# Patient Record
Sex: Male | Born: 1980 | Race: Black or African American | Hispanic: No | Marital: Married | Smoking: Never smoker
Health system: Southern US, Community
[De-identification: ages and names within clinical notes are randomized; demographics above are authoritative.]

## PROBLEM LIST (undated history)

## (undated) DIAGNOSIS — I10 Essential (primary) hypertension: Secondary | ICD-10-CM

---

## 2011-07-06 ENCOUNTER — Ambulatory Visit: Payer: Self-pay | Admitting: Internal Medicine

## 2015-04-26 ENCOUNTER — Other Ambulatory Visit: Payer: Self-pay | Admitting: Orthopedic Surgery

## 2015-04-26 DIAGNOSIS — M25561 Pain in right knee: Secondary | ICD-10-CM

## 2015-04-26 DIAGNOSIS — M25562 Pain in left knee: Secondary | ICD-10-CM

## 2015-05-18 ENCOUNTER — Ambulatory Visit
Admission: RE | Admit: 2015-05-18 | Discharge: 2015-05-18 | Disposition: A | Discharge status: Home / Self Care | Source: Ambulatory Visit | Attending: Orthopedic Surgery | Admitting: Orthopedic Surgery

## 2015-05-18 DIAGNOSIS — M25561 Pain in right knee: Secondary | ICD-10-CM | POA: Insufficient documentation

## 2015-05-18 DIAGNOSIS — M94261 Chondromalacia, right knee: Secondary | ICD-10-CM | POA: Insufficient documentation

## 2015-05-18 DIAGNOSIS — M25562 Pain in left knee: Secondary | ICD-10-CM | POA: Diagnosis present

## 2015-05-18 DIAGNOSIS — M1711 Unilateral primary osteoarthritis, right knee: Secondary | ICD-10-CM | POA: Insufficient documentation

## 2015-05-18 DIAGNOSIS — M67962 Unspecified disorder of synovium and tendon, left lower leg: Secondary | ICD-10-CM | POA: Diagnosis not present

## 2017-05-15 IMAGING — MR MR KNEE*L* W/O CM
6 series · 38 of 40 positions shown · non-contrast
Comparison: None.

CLINICAL DATA: Twisting injury both knees 2-3 months ago with
continued pain. Initial encounter.

EXAM:
MRI OF THE LEFT KNEE WITHOUT CONTRAST
TECHNIQUE: Multiplanar, multisequence MR imaging of the knee was performed. No
intravenous contrast was administered.

[Series 3: PD fat-sat · axial · 3.0mm · 0.50mm/px · z∈[-88,+41]mm · 8 of 40 slices shown (1 of 4)]
[im 1/40]
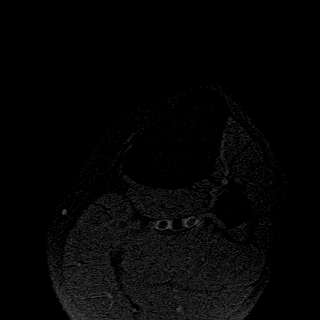
[im 5/40]
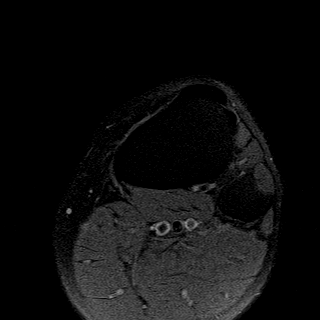
[im 14/40]
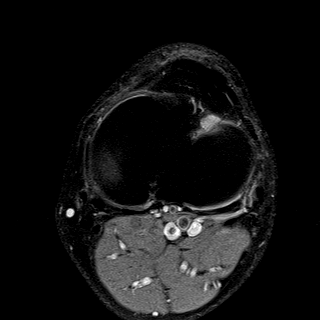
[im 18/40]
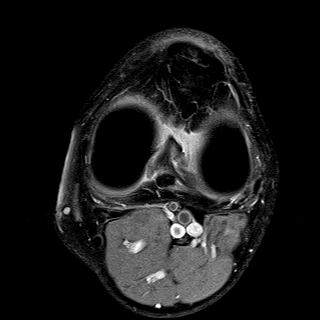
[im 22/40]
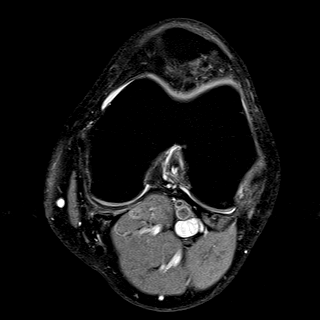
[im 27/40]
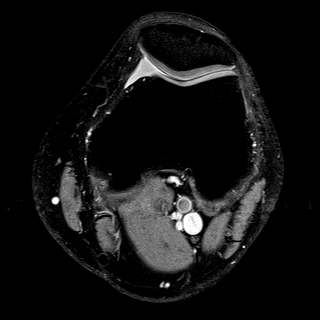
[im 35/40]
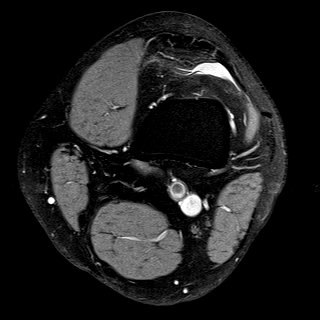
[im 40/40]
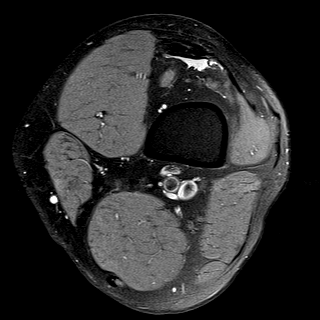

[Series 4: T1 · coronal · 3.0mm · 0.50mm/px · 8 of 35 slices shown]
[im 1/35]
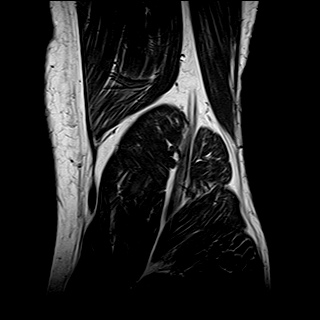
[im 5/35]
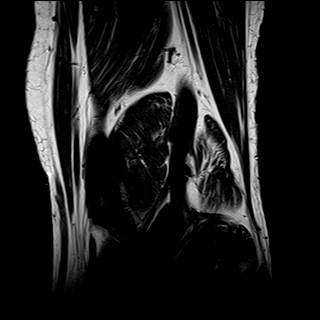
[im 10/35]
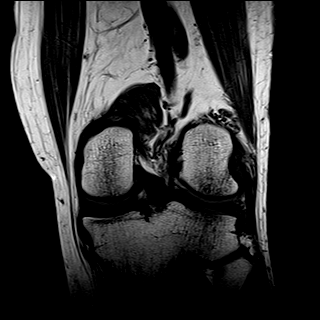
[im 15/35]
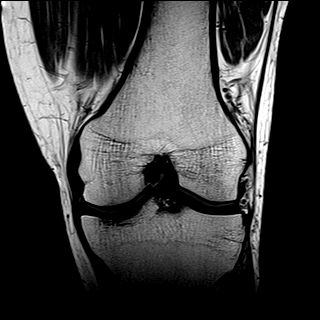
[im 20/35]
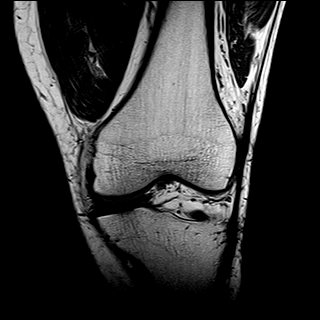
[im 25/35]
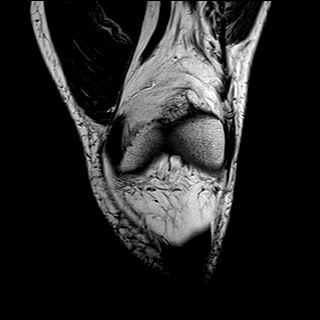
[im 30/35]
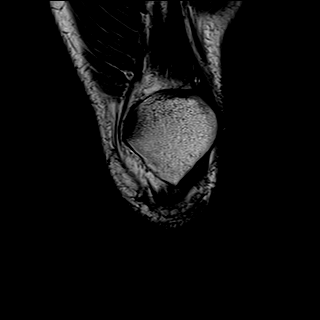
[im 35/35]
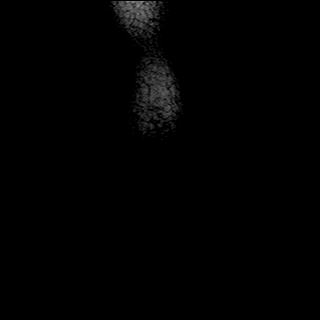

[Series 5: PD fat-sat · sagittal · 3.0mm · 0.62mm/px · 8 of 35 slices shown (2 of 4)]
[im 1/35]
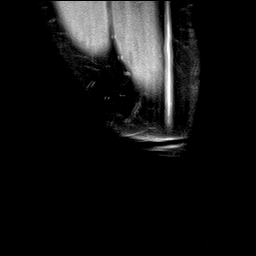
[im 5/35]
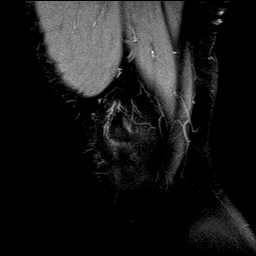
[im 10/35]
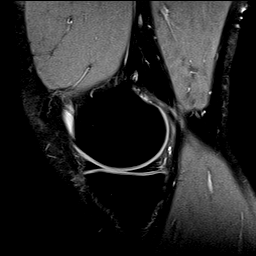
[im 15/35]
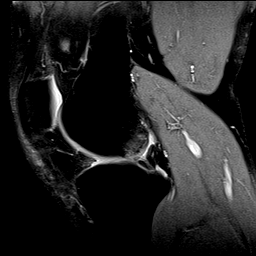
[im 20/35]
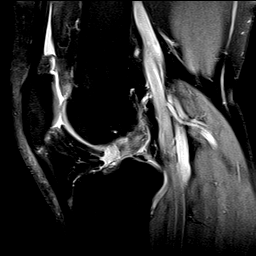
[im 25/35]
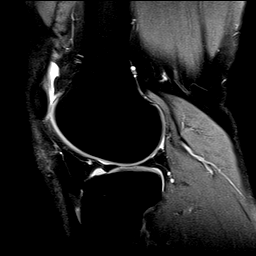
[im 30/35]
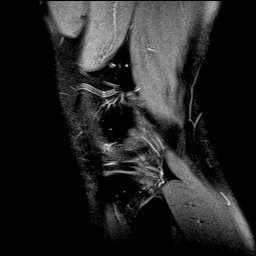
[im 35/35]
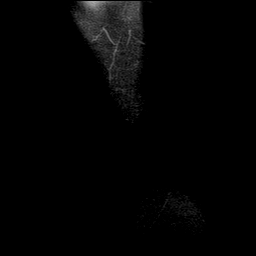

[Series 6: T2 fat-sat · coronal · 3.0mm · 0.31mm/px · 8 of 35 slices shown]
[im 1/35]
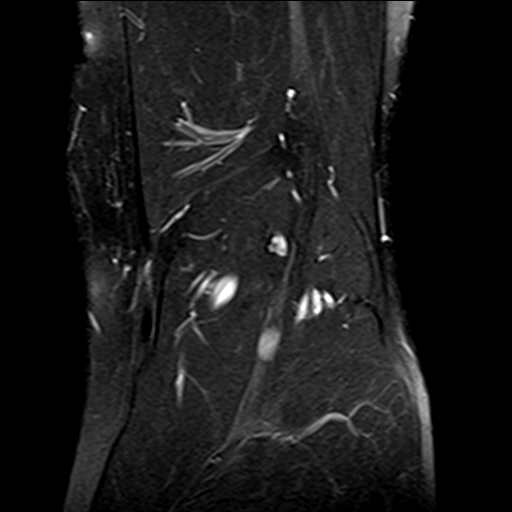
[im 5/35]
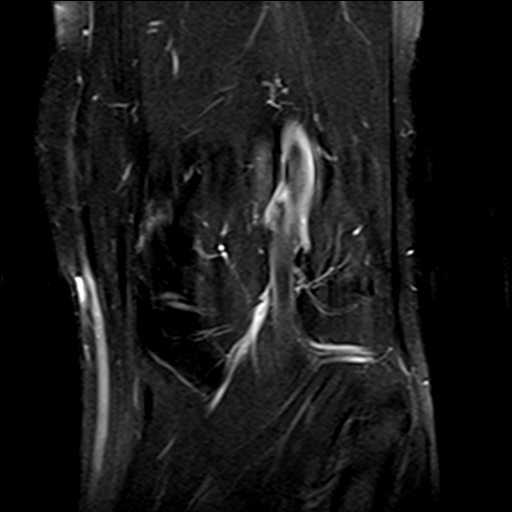
[im 10/35]
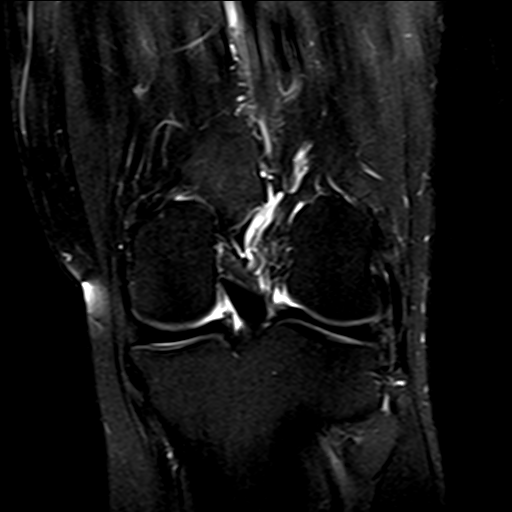
[im 15/35]
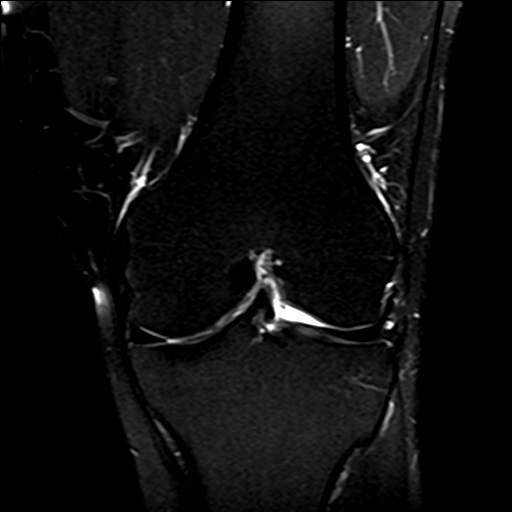
[im 20/35]
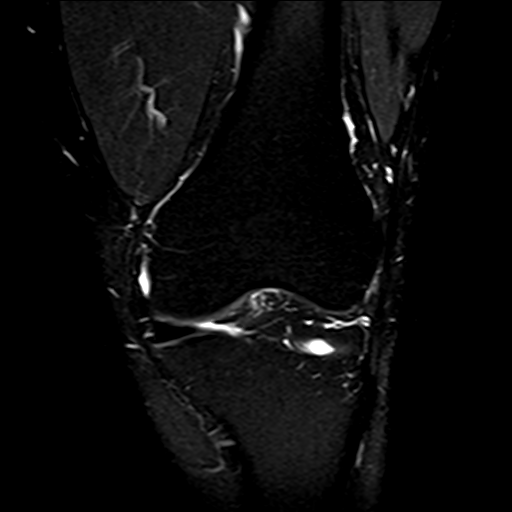
[im 25/35]
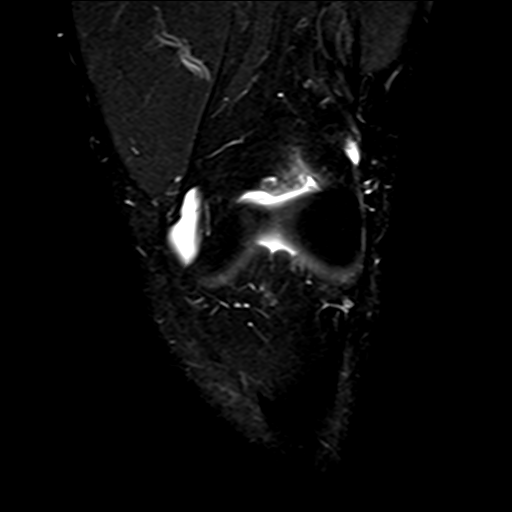
[im 30/35]
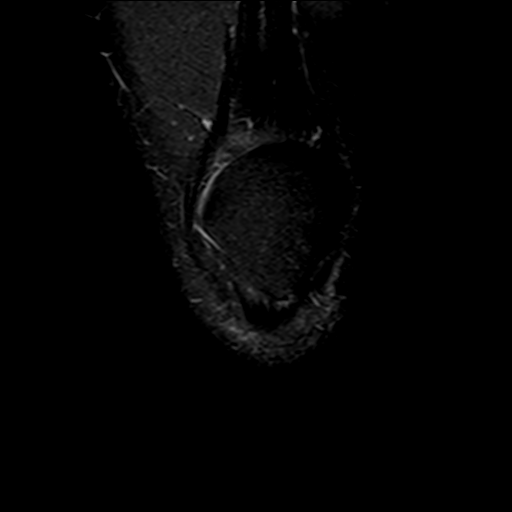
[im 35/35]
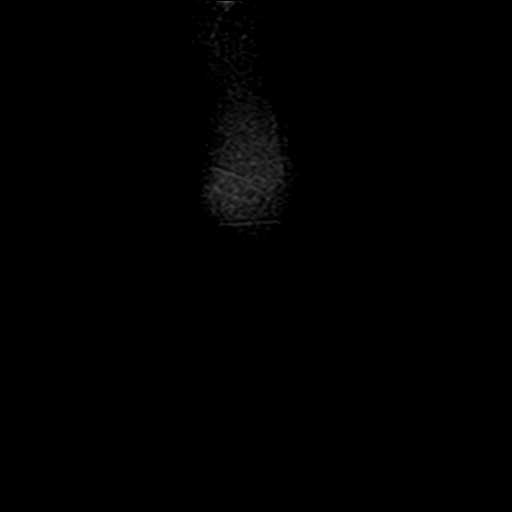

[Series 7: PD fat-sat · coronal · 3.0mm · 0.50mm/px · 4 of 18 slices shown (3 of 4)]
[im 1/18]
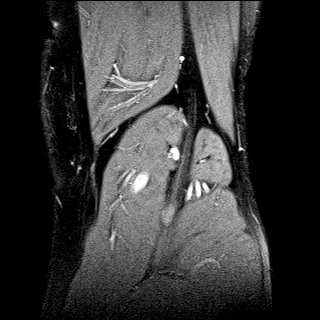
[im 6/18]
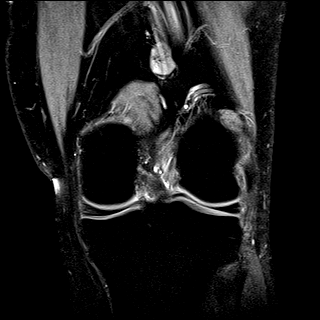
[im 12/18]
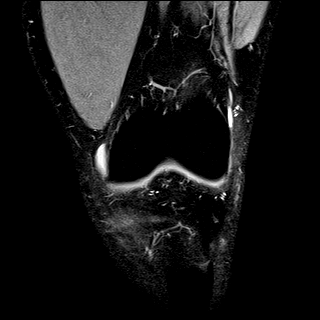
[im 18/18]
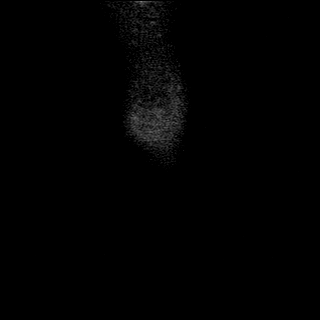

[Series 8: PD fat-sat · coronal · 2.0mm · 0.62mm/px · 2 of 7 slices shown (4 of 4)]
[im 1/7]
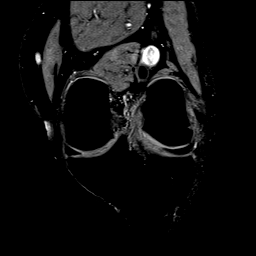
[im 7/7]
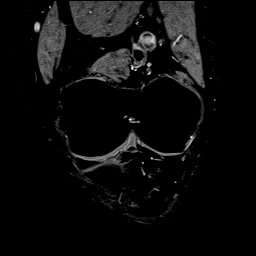

[38 of 40 positions shown; findings below may reference images not displayed]

FINDINGS: MENISCI

Medial meniscus: Degenerative signal seen in the posterior horn with
irregularity along the capsular surface. No abnormal signal reaching
an articular surface is identified.

Lateral meniscus: Partial discoid lateral meniscus without tear is
seen.

LIGAMENTS

Cruciates:  Intact.

Collaterals:  Intact.

CARTILAGE

Patellofemoral:  Unremarkable.

Medial:  Unremarkable.

Lateral:  Unremarkable.

Joint:  Small joint effusion.

Popliteal Fossa:  No Baker's cyst.

Extensor Mechanism: Intact. There is increased T2 signal in the
central, superior fibers of the patellar tendon consistent with
tendinosis without tear.

Bones:  Normal marrow signal throughout.
IMPRESSION: Mild appearing tendinosis of the superior patellar tendon is
consistent with jumper's knee. No tear.

Negative for meniscal or ligament tear. Partial discoid lateral
meniscus is noted.

## 2022-06-07 ENCOUNTER — Observation Stay
Admission: EM | Admit: 2022-06-07 | Discharge: 2022-06-08 | Disposition: A | Discharge status: Home / Self Care | Payer: Federal, State, Local not specified - PPO | Attending: Student | Admitting: Student

## 2022-06-07 ENCOUNTER — Other Ambulatory Visit: Payer: Self-pay

## 2022-06-07 ENCOUNTER — Emergency Department: Payer: Federal, State, Local not specified - PPO

## 2022-06-07 ENCOUNTER — Encounter: Payer: Self-pay | Admitting: Student

## 2022-06-07 DIAGNOSIS — K922 Gastrointestinal hemorrhage, unspecified: Secondary | ICD-10-CM | POA: Diagnosis not present

## 2022-06-07 DIAGNOSIS — I10 Essential (primary) hypertension: Secondary | ICD-10-CM | POA: Diagnosis not present

## 2022-06-07 DIAGNOSIS — K920 Hematemesis: Secondary | ICD-10-CM | POA: Diagnosis present

## 2022-06-07 DIAGNOSIS — R000 Tachycardia, unspecified: Secondary | ICD-10-CM | POA: Insufficient documentation

## 2022-06-07 DIAGNOSIS — K2091 Esophagitis, unspecified with bleeding: Secondary | ICD-10-CM | POA: Diagnosis not present

## 2022-06-07 DIAGNOSIS — Z79899 Other long term (current) drug therapy: Secondary | ICD-10-CM | POA: Diagnosis not present

## 2022-06-07 LAB — COMPREHENSIVE METABOLIC PANEL
ALT: 74 U/L — ABNORMAL HIGH (ref 0–44)
AST: 93 U/L — ABNORMAL HIGH (ref 15–41)
Albumin: 4.9 g/dL (ref 3.5–5.0)
Alkaline Phosphatase: 74 U/L (ref 38–126)
Anion gap: 15 (ref 5–15)
BUN: 16 mg/dL (ref 6–20)
CO2: 25 mmol/L (ref 22–32)
Calcium: 9.9 mg/dL (ref 8.9–10.3)
Chloride: 99 mmol/L (ref 98–111)
Creatinine, Ser: 1.47 mg/dL — ABNORMAL HIGH (ref 0.61–1.24)
GFR, Estimated: >60 mL/min (ref >60)
Glucose, Bld: 124 mg/dL — ABNORMAL HIGH (ref 70–99)
Potassium: 3.9 mmol/L (ref 3.5–5.1)
Sodium: 139 mmol/L (ref 135–145)
Total Bilirubin: 0.9 mg/dL (ref 0.3–1.2)
Total Protein: 9.8 g/dL — ABNORMAL HIGH (ref 6.5–8.1)

## 2022-06-07 LAB — URINE DRUG SCREEN, QUALITATIVE (ARMC ONLY)
Amphetamines, Ur Screen: NOT DETECTED
Barbiturates, Ur Screen: NOT DETECTED
Benzodiazepine, Ur Scrn: NOT DETECTED
Cannabinoid 50 Ng, Ur ~~LOC~~: POSITIVE — AB
Cocaine Metabolite,Ur ~~LOC~~: NOT DETECTED
MDMA (Ecstasy)Ur Screen: NOT DETECTED
Methadone Scn, Ur: NOT DETECTED
Opiate, Ur Screen: NOT DETECTED
Phencyclidine (PCP) Ur S: NOT DETECTED
Tricyclic, Ur Screen: NOT DETECTED

## 2022-06-07 LAB — TYPE AND SCREEN
ABO/RH(D): B POS
Antibody Screen: NEGATIVE

## 2022-06-07 LAB — CBC
HCT: 46.1 % (ref 39.0–52.0)
Hemoglobin: 15.3 g/dL (ref 13.0–17.0)
MCH: 28.9 pg (ref 26.0–34.0)
MCHC: 33.2 g/dL (ref 30.0–36.0)
MCV: 87.1 fL (ref 80.0–100.0)
Platelets: 302 10*3/uL (ref 150–400)
RBC: 5.29 MIL/uL (ref 4.22–5.81)
RDW: 13.2 % (ref 11.5–15.5)
WBC: 4.8 10*3/uL (ref 4.0–10.5)
nRBC: 0 % (ref 0.0–0.2)

## 2022-06-07 LAB — PHOSPHORUS: Phosphorus: 2.7 mg/dL (ref 2.5–4.6)

## 2022-06-07 LAB — MAGNESIUM: Magnesium: 1.7 mg/dL (ref 1.7–2.4)

## 2022-06-07 MED ORDER — ACETAMINOPHEN 650 MG RE SUPP: 650.0000 mg | Freq: Four times a day (QID) | RECTAL | Status: DC | PRN

## 2022-06-07 MED ORDER — ACETAMINOPHEN 325 MG PO TABS: 650.0000 mg | ORAL_TABLET | Freq: Four times a day (QID) | ORAL | Status: DC | PRN

## 2022-06-07 MED ORDER — METOPROLOL TARTRATE 50 MG PO TABS
50.0000 mg | ORAL_TABLET | Freq: Two times a day (BID) | ORAL | Status: DC
  Administered 2022-06-07 – 2022-06-08 (×2): 50 mg via ORAL
  Filled 2022-06-07: qty 2
  Filled 2022-06-07 (×2): qty 1

## 2022-06-07 MED ORDER — LORAZEPAM 1 MG PO TABS: 1.0000 mg | ORAL_TABLET | ORAL | Status: DC | PRN

## 2022-06-07 MED ORDER — LORAZEPAM 2 MG/ML IJ SOLN: 1.0000 mg | INTRAMUSCULAR | Status: DC | PRN

## 2022-06-07 MED ORDER — SODIUM CHLORIDE 0.9% FLUSH: 3.0000 mL | INTRAVENOUS | Status: DC | PRN

## 2022-06-07 MED ORDER — ADULT MULTIVITAMIN W/MINERALS CH
1.0000 | ORAL_TABLET | Freq: Every day | ORAL | Status: DC
  Administered 2022-06-07: 1 via ORAL
  Filled 2022-06-07 (×2): qty 1

## 2022-06-07 MED ORDER — FOLIC ACID 1 MG PO TABS
1.0000 mg | ORAL_TABLET | Freq: Every day | ORAL | Status: DC
  Administered 2022-06-07: 1 mg via ORAL
  Filled 2022-06-07 (×2): qty 1

## 2022-06-07 MED ORDER — PANTOPRAZOLE 80MG IVPB - SIMPLE MED
80.0000 mg | Freq: Once | INTRAVENOUS | Status: AC
  Administered 2022-06-07: 80 mg via INTRAVENOUS
  Filled 2022-06-07: qty 100

## 2022-06-07 MED ORDER — HYDRALAZINE HCL 20 MG/ML IJ SOLN: 10.0000 mg | Freq: Four times a day (QID) | INTRAMUSCULAR | Status: DC | PRN

## 2022-06-07 MED ORDER — SODIUM CHLORIDE 0.9 % IV BOLUS
1000.0000 mL | Freq: Once | INTRAVENOUS | Status: AC
  Administered 2022-06-07: 1000 mL via INTRAVENOUS

## 2022-06-07 MED ORDER — AMLODIPINE BESYLATE 10 MG PO TABS
10.0000 mg | ORAL_TABLET | Freq: Every day | ORAL | Status: DC
  Administered 2022-06-07: 10 mg via ORAL
  Filled 2022-06-07: qty 1

## 2022-06-07 MED ORDER — SODIUM CHLORIDE 0.9 % IV SOLN: 250.0000 mL | INTRAVENOUS | Status: DC | PRN

## 2022-06-07 MED ORDER — PANTOPRAZOLE SODIUM 40 MG IV SOLR
40.0000 mg | Freq: Two times a day (BID) | INTRAVENOUS | Status: DC
  Administered 2022-06-07 – 2022-06-08 (×2): 40 mg via INTRAVENOUS
  Filled 2022-06-07 (×2): qty 10

## 2022-06-07 MED ORDER — THIAMINE MONONITRATE 100 MG PO TABS
100.0000 mg | ORAL_TABLET | Freq: Every day | ORAL | Status: DC
  Administered 2022-06-07: 100 mg via ORAL
  Filled 2022-06-07 (×2): qty 1

## 2022-06-07 MED ORDER — SODIUM CHLORIDE 0.9% FLUSH
3.0000 mL | Freq: Two times a day (BID) | INTRAVENOUS | Status: DC
  Administered 2022-06-07 – 2022-06-08 (×2): 3 mL via INTRAVENOUS

## 2022-06-07 MED ORDER — SODIUM CHLORIDE 0.9 % IV SOLN: INTRAVENOUS | Status: DC

## 2022-06-07 NOTE — ED Provider Notes (Signed)
[facility] Provider Note  Patient Contact: 4:58 PM (approximate)   History   Hematemesis   HPI  Louis Perry is a 42 y.o. male with a largely unremarkable past medical history, presents to the emergency department after patient had several episodes of hemoptysis earlier today after drinking pineapple juice.  Patient reports that he does have a history of acid reflux.  He states that he had about a tablespoon of blood during each episode of emesis.  Patient states that he drinks approximately a pint of hard liquor a night since Christmas.  He denies daily smoking.  No prior history of esophageal varices or GI bleed to his knowledge.  Patient denies dark tarry stools.  He denies chest pain, chest tightness or shortness of breath.  No prodrome of viral URI-like illness.  Patient denies blood thinner usage or chronic usage of anti-inflammatories.  Patient denies any current discomfort.      Physical Exam   Triage Vital Signs: ED Triage Vitals  Enc Vitals Group     BP 06/07/22 1515 (!) 178/135     Pulse Rate 06/07/22 1515 (!) 120     Resp 06/07/22 1515 17     Temp 06/07/22 1515 98.8 F (37.1 C)     Temp Source 06/07/22 1515 Oral     SpO2 06/07/22 1515 98 %     Weight 06/07/22 1516 245 lb (111.1 kg)     Height --      Head Circumference --      Peak Flow --      Pain Score 06/07/22 1515 8     Pain Loc --      Pain Edu? --      Excl. in [place]? --     Most recent vital signs: Vitals:   06/07/22 1515  BP: (!) 178/135  Pulse: (!) 120  Resp: 17  Temp: 98.8 F (37.1 C)  SpO2: 98%     General: Alert and in no acute distress. Eyes:  PERRL. EOMI. Head: No acute traumatic findings ENT:      Nose: No congestion/rhinnorhea.      Mouth/Throat: Mucous membranes are moist.  Neck: No stridor. No cervical spine tenderness to palpation. Hematological/Lymphatic/Immunilogical: No cervical lymphadenopathy. Cardiovascular:  Good peripheral  perfusion Respiratory: Normal respiratory effort without tachypnea or retractions. Lungs CTAB. Good air entry to the bases with no decreased or absent breath sounds. Gastrointestinal: Bowel sounds 4 quadrants. Soft and nontender to palpation. No guarding or rigidity. No palpable masses. No distention. No CVA tenderness. Musculoskeletal: Full range of motion to all extremities.  Neurologic:  No gross focal neurologic deficits are appreciated.  Skin:   No rash noted Other:   ED Results / Procedures / Treatments   Labs (all labs ordered are listed, but only abnormal results are displayed) Labs Reviewed  COMPREHENSIVE METABOLIC PANEL - Abnormal; Notable for the following components:      Result Value   Glucose, Bld 124 (*)    Creatinine, Ser 1.47 (*)    Total Protein 9.8 (*)    AST 93 (*)    ALT 74 (*)    All other components within normal limits  CBC  HIV ANTIBODY (ROUTINE TESTING W REFLEX)  COMPREHENSIVE METABOLIC PANEL  CBC  PROTIME-INR  PHOSPHORUS  MAGNESIUM  MAGNESIUM  PHOSPHORUS  ETHANOL  URINE DRUG SCREEN, QUALITATIVE ([facility] ONLY)  POC OCCULT BLOOD, ED  TYPE AND SCREEN     EKG  Sinus tachycardia without ST  segment elevation or other apparent arrhythmia.   RADIOLOGY  I personally viewed and evaluated these images as part of my medical decision making, as well as reviewing the written report by the radiologist.  ED Provider Interpretation: Chest x-ray unremarkable.   PROCEDURES:  Critical Care performed: No  Procedures   MEDICATIONS ORDERED IN ED: Medications  sodium chloride flush (NS) 0.9 % injection 3 mL (has no administration in time range)  sodium chloride flush (NS) 0.9 % injection 3 mL (has no administration in time range)  0.9 %  sodium chloride infusion (has no administration in time range)  0.9 %  sodium chloride infusion (has no administration in time range)  acetaminophen (TYLENOL) tablet 650 mg (has no administration in time range)    Or   acetaminophen (TYLENOL) suppository 650 mg (has no administration in time range)  folic acid (FOLVITE) tablet 1 mg (has no administration in time range)  multivitamin with minerals tablet 1 tablet (has no administration in time range)  thiamine (VITAMIN B1) tablet 100 mg (has no administration in time range)  hydrALAZINE (APRESOLINE) injection 10 mg (has no administration in time range)  LORazepam (ATIVAN) tablet 1-4 mg (has no administration in time range)    Or  LORazepam (ATIVAN) injection 1-4 mg (has no administration in time range)  pantoprazole (PROTONIX) injection 40 mg (has no administration in time range)  metoprolol tartrate (LOPRESSOR) tablet 50 mg (has no administration in time range)  pantoprazole (PROTONIX) 80 mg /NS 100 mL IVPB (0 mg Intravenous Stopped 06/07/22 1755)  sodium chloride 0.9 % bolus 1,000 mL (1,000 mLs Intravenous New Bag/Given 06/07/22 1729)     IMPRESSION / MDM / ASSESSMENT AND PLAN / ED COURSE  I reviewed the triage vital signs and the nursing notes.                              Assessment and plan GI bleed 42 year old male presents to the emergency department with 2 episodes of hemoptysis that started today with a history of heavy alcohol consumption.  No history of esophageal varices, known gastritis, known GI bleed, use of NSAIDs or daily smoking.  Patient does have a history of GERD.  Patient was hypertensive and tachycardic at triage but vital signs otherwise reassuring.  On exam, patient alert, active and nontoxic-appearing with no increased work of breathing.  Hemoccult negative.  H&H stable.  Patient did had elevation in AST and ALT at 93 and 74 respectively.  CBC within range.  Chest x-ray unremarkable.  Given onset of symptoms today, patient will be admitted to the hospitalist service to trend H&H.  Patient was admitted to the hospitalist service under the care of of Dr.Dileep Dwyane Dee.  GI specialist on-call, Dr. Marius Ditch was notified of patient's  admission via secure chat. Clinical Course as of 06/07/22 1759  Thu Jun 07, 2022  1719 Platelets: 302 [JW]    Clinical Course User Index [JW] Lannie Fields, [place]     FINAL CLINICAL IMPRESSION(S) / ED DIAGNOSES   Final diagnoses:  Gastrointestinal hemorrhage, unspecified gastrointestinal hemorrhage type     Rx / DC Orders   ED Discharge Orders     None        Note:  This document was prepared using Dragon voice recognition software and may include unintentional dictation errors.   Vallarie Mare Praesel, PA-C 06/07/22 1759    Harvest Dark, MD 06/08/22 1925

## 2022-06-07 NOTE — ED Triage Notes (Signed)
Pt sts that he was feeling off this morning and on the way here today he started to vomit up blood. Pt sts that it is bright red.

## 2022-06-07 NOTE — H&P (Signed)
[facility] History and Physical   Patient: Louis Perry VQM:[id number]   PCP: System, Provider Not In DOB: 1980-06-23   DOA: 06/07/2022   DOS: 06/08/2022   DOS: the patient was seen and examined on 06/07/2022  Patient coming from: The patient is coming from Home  Chief Complaint: Hematemesis, bright red bleeding  HPI: Louis Perry is a 42 y.o. male with Past medical history of hypertension, presented at [facility] ED with complaining of hematemesis.  As per patient he was working on the car off his wife and then he was not feeling good, after some time he started having vomiting and noticed blood, patient had 4 episodes of hematemesis which was increasing in amount and he got scared and came to the hospital.  Recently patient is being drinking more alcohol, he normally drinks every day and continues to drinking for past more than 5 years.  Denies any withdrawal symptoms.  Denies any prior history of GI bleeding.  Denies NSAID use, and  denies any drug abuse.  Denies any chest pain or palpitations, no shortness of breath.  ED Course: H&H 15.3/46 stable Creatinine 1.47 slightly elevated, blood glucose 124 slightly elevated, AST 93, ALT 74 slightly elevated, rest of the labs within normal range. CXR negative for any acute findings   Review of Systems: as mentioned in the history of present illness.  All other systems reviewed and are negative.  History reviewed.  Past medical history of hypertension History reviewed. No pertinent surgical history. Social History:  reports that he has never smoked. He has never used smokeless tobacco. He reports current alcohol use. No history on file for drug use.  Allergies  Allergen Reactions   Penicillins Rash    Family history reviewed and not pertinent History reviewed. No pertinent family history.   Prior to Admission medications   Medication Sig Start Date End Date Taking? Authorizing Provider  amLODipine (NORVASC) 10 MG tablet  Take 10 mg by mouth daily. 02/12/22  Yes [provider]  atorvastatin (LIPITOR) 40 MG tablet Take 0.5 tablets by mouth at bedtime. 05/30/22  Yes [provider]  carvedilol (COREG) 12.5 MG tablet Take 12.5 mg by mouth every 12 (twelve) hours. 07/21/21  Yes [provider]  clindamycin (CLEOCIN) 300 MG capsule Take 300 mg by mouth every 8 (eight) hours. 06/05/22  Yes [provider]  hydrALAZINE (APRESOLINE) 100 MG tablet Take 50 mg by mouth 3 (three) times daily. 07/21/21  Yes [provider]  sildenafil (REVATIO) 20 MG tablet Take 60 mg by mouth daily as needed.    [provider]    Physical Exam: Vitals:   06/07/22 2123 06/08/22 0316 06/08/22 0430 06/08/22 0801  BP: (!) 169/112 (!) 161/106  (!) 166/106  Pulse: 86 78 83 68  Resp: 20 16  20   Temp: 99.5 F (37.5 C) 97.7 F (36.5 C)  98 F (36.7 C)  TempSrc: Oral Oral  Oral  SpO2: 97% 95%  100%  Weight: 110.5 kg     Height: 5\' 11"  (1.803 m)       General: alert and oriented to time, place, and person. Appear in no distress, affect appropriate Eyes: PERRLA, Conjunctiva normal ENT: Oral Mucosa Clear, moist  Neck: no JVD, no Abnormal Mass Or lumps Cardiovascular: S1 and S2 Present, no Murmur, peripheral pulses symmetrical Respiratory: good respiratory effort, Bilateral Air entry equal and Decreased, no signs of accessory muscle use, Clear to Auscultation, no Crackles, no wheezes Abdomen: Bowel Sound present,  Soft and no tenderness, no hernia Skin: no rashes  Extremities: no Pedal edema, no calf tenderness Neurologic: without any new focal findings Gait not checked due to patient safety concerns  Data Reviewed: I have personally reviewed and interpreted labs, imaging as discussed below.  CBC: Recent Labs  Lab 06/07/22 1526 06/08/22 0430  WBC 4.8 4.8  HGB 15.3 14.1  HCT 46.1 42.6  MCV 87.1 87.8  PLT 302 865   Basic Metabolic Panel: Recent Labs  Lab 06/07/22 1526  06/08/22 0430  NA 139 136  K 3.9 3.1*  CL 99 98  CO2 25 24  GLUCOSE 124* 108*  BUN 16 11  CREATININE 1.47* 1.29*  CALCIUM 9.9 8.8*  MG 1.7 1.5*  PHOS 2.7 3.2   GFR: Estimated Creatinine Clearance: 95.3 mL/min (A) (by C-G formula based on SCr of 1.29 mg/dL (H)). Liver Function Tests: Recent Labs  Lab 06/07/22 1526 06/08/22 0430  AST 93* 64*  ALT 74* 56*  ALKPHOS 74 57  BILITOT 0.9 1.4*  PROT 9.8* 8.1  ALBUMIN 4.9 4.1   No results for input(s): "LIPASE", "AMYLASE" in the last 168 hours. No results for input(s): "AMMONIA" in the last 168 hours. Coagulation Profile: Recent Labs  Lab 06/08/22 0430  INR 1.1   Cardiac Enzymes: No results for input(s): "CKTOTAL", "CKMB", "CKMBINDEX", "TROPONINI" in the last 168 hours. BNP (last 3 results) No results for input(s): "PROBNP" in the last 8760 hours. HbA1C: No results for input(s): "HGBA1C" in the last 72 hours. CBG: No results for input(s): "GLUCAP" in the last 168 hours. Lipid Profile: No results for input(s): "CHOL", "HDL", "LDLCALC", "TRIG", "CHOLHDL", "LDLDIRECT" in the last 72 hours. Thyroid Function Tests: No results for input(s): "TSH", "T4TOTAL", "FREET4", "T3FREE", "THYROIDAB" in the last 72 hours. Anemia Panel: No results for input(s): "VITAMINB12", "FOLATE", "FERRITIN", "TIBC", "IRON", "RETICCTPCT" in the last 72 hours. Urine analysis: No results found for: "COLORURINE", "APPEARANCEUR", "LABSPEC", "PHURINE", "GLUCOSEU", "HGBUR", "BILIRUBINUR", "KETONESUR", "PROTEINUR", "UROBILINOGEN", "NITRITE", "LEUKOCYTESUR"  Radiological Exams on Admission: DG Chest 2 View  Result Date: 06/07/2022 CLINICAL DATA:  Hemoptysis. EXAM: CHEST - 2 VIEW COMPARISON:  None Available. FINDINGS: Cardiac silhouette and mediastinal contours are within normal limits. The lungs are clear. No pleural effusion or pneumothorax. Mild multilevel degenerative disc changes of the thoracic spine. IMPRESSION: No active cardiopulmonary disease.  Electronically Signed   By: Yvonne Kendall M.D.   On: 06/07/2022 17:21    I reviewed all nursing notes, pharmacy notes, vitals, pertinent old records.  Assessment/Plan Principal Problem:   Upper GI bleeding   # Upper GI bleeding Patient presented with hematemesis, history of heavy alcohol drinking for past 1 month Pantoprazole 80 mg x 1 dose given in the ED Started pantoprazole 40 mg IV twice daily Started clear liquid diet, keep n.p.o. after midnight GI consulted, possible EGD tomorrow a.m. Monitor H&H and transfuse if hemoglobin less than 7    # Hypertension and tachycardia Started metoprolol 50 mg p.o. twice daily Use IV metoprolol and hydralazine as needed Monitor BP and titrate medications accordingly.    # EtOH abuse Watch for withdrawal symptoms, continue CIWA protocol Started thiamine, folic acid and multivitamin   Nutrition: Clear liquid diet DVT Prophylaxis: SCD, pharmacological prophylaxis contraindicated due to upper GI bleeding  Advance goals of care discussion: Full code   Consults: GI, Dr Marius Ditch was notified by ED physician  Family Communication: family was Not present at bedside, at the time of interview.  Opportunity was given to ask question and all  questions were answered satisfactorily.  Disposition: Admitted as inpatient, telemetry unit. Likely to be discharged home, in 2-3 days, after clearance by GI.  I have discussed plan of care as described above with RN and patient/family.  Severity of Illness: The appropriate patient status for this patient is INPATIENT. Inpatient status is judged to be reasonable and necessary in order to provide the required intensity of service to ensure the patient's safety. The patient's presenting symptoms, physical exam findings, and initial radiographic and laboratory data in the context of their chronic comorbidities is felt to place them at high risk for further clinical deterioration. Furthermore, it is not  anticipated that the patient will be medically stable for discharge from the hospital within 2 midnights of admission.   * I certify that at the point of admission it is my clinical judgment that the patient will require inpatient hospital care spanning beyond 2 midnights from the point of admission due to high intensity of service, high risk for further deterioration and high frequency of surveillance required.*   Author: Gillis Santa, MD [facility] 06/08/2022 8:24 AM    Total Time spend: 60 minutes  To reach On-call, see care teams to locate the attending and reach out to them via [email or web address]. If 7PM-7AM, please contact night-coverage If you still have difficulty reaching the attending provider, please page the [employer] (Director on Call) for [facility] on amion for assistance.

## 2022-06-08 ENCOUNTER — Inpatient Hospital Stay: Payer: Federal, State, Local not specified - PPO | Admitting: Anesthesiology

## 2022-06-08 ENCOUNTER — Encounter: Payer: Self-pay | Admitting: Student

## 2022-06-08 ENCOUNTER — Encounter
Admission: EM | Disposition: A | Discharge status: Home / Self Care | Payer: Self-pay | Source: Home / Self Care | Attending: Emergency Medicine

## 2022-06-08 DIAGNOSIS — K922 Gastrointestinal hemorrhage, unspecified: Secondary | ICD-10-CM | POA: Diagnosis present

## 2022-06-08 HISTORY — PX: ESOPHAGOGASTRODUODENOSCOPY (EGD) WITH PROPOFOL: SHX5813

## 2022-06-08 LAB — PROTIME-INR
INR: 1.1 (ref 0.8–1.2)
Prothrombin Time: 14.4 seconds (ref 11.4–15.2)

## 2022-06-08 LAB — CBC
HCT: 42.6 % (ref 39.0–52.0)
Hemoglobin: 14.1 g/dL (ref 13.0–17.0)
MCH: 29.1 pg (ref 26.0–34.0)
MCHC: 33.1 g/dL (ref 30.0–36.0)
MCV: 87.8 fL (ref 80.0–100.0)
Platelets: 278 10*3/uL (ref 150–400)
RBC: 4.85 MIL/uL (ref 4.22–5.81)
RDW: 13 % (ref 11.5–15.5)
WBC: 4.8 10*3/uL (ref 4.0–10.5)
nRBC: 0 % (ref 0.0–0.2)

## 2022-06-08 LAB — HIV ANTIBODY (ROUTINE TESTING W REFLEX): HIV Screen 4th Generation wRfx: NONREACTIVE

## 2022-06-08 LAB — COMPREHENSIVE METABOLIC PANEL
ALT: 56 U/L — ABNORMAL HIGH (ref 0–44)
AST: 64 U/L — ABNORMAL HIGH (ref 15–41)
Albumin: 4.1 g/dL (ref 3.5–5.0)
Alkaline Phosphatase: 57 U/L (ref 38–126)
Anion gap: 14 (ref 5–15)
BUN: 11 mg/dL (ref 6–20)
CO2: 24 mmol/L (ref 22–32)
Calcium: 8.8 mg/dL — ABNORMAL LOW (ref 8.9–10.3)
Chloride: 98 mmol/L (ref 98–111)
Creatinine, Ser: 1.29 mg/dL — ABNORMAL HIGH (ref 0.61–1.24)
GFR, Estimated: >60 mL/min (ref >60)
Glucose, Bld: 108 mg/dL — ABNORMAL HIGH (ref 70–99)
Potassium: 3.1 mmol/L — ABNORMAL LOW (ref 3.5–5.1)
Sodium: 136 mmol/L (ref 135–145)
Total Bilirubin: 1.4 mg/dL — ABNORMAL HIGH (ref 0.3–1.2)
Total Protein: 8.1 g/dL (ref 6.5–8.1)

## 2022-06-08 LAB — PHOSPHORUS: Phosphorus: 3.2 mg/dL (ref 2.5–4.6)

## 2022-06-08 LAB — ETHANOL: Alcohol, Ethyl (B): <10 mg/dL (ref <10)

## 2022-06-08 LAB — MAGNESIUM: Magnesium: 1.5 mg/dL — ABNORMAL LOW (ref 1.7–2.4)

## 2022-06-08 SURGERY — ESOPHAGOGASTRODUODENOSCOPY (EGD) WITH PROPOFOL
Anesthesia: General

## 2022-06-08 MED ORDER — DEXMEDETOMIDINE HCL IN NACL 200 MCG/50ML IV SOLN
INTRAVENOUS | Status: DC | PRN
  Administered 2022-06-08: 12 ug via INTRAVENOUS
  Administered 2022-06-08: 8 ug via INTRAVENOUS

## 2022-06-08 MED ORDER — POTASSIUM CHLORIDE 10 MEQ/100ML IV SOLN
10.0000 meq | INTRAVENOUS | Status: AC
  Administered 2022-06-08 (×2): 10 meq via INTRAVENOUS
  Filled 2022-06-08 (×2): qty 100

## 2022-06-08 MED ORDER — PROPOFOL 10 MG/ML IV BOLUS
INTRAVENOUS | Status: DC | PRN
  Administered 2022-06-08 (×2): 40 mg via INTRAVENOUS
  Administered 2022-06-08: 30 mg via INTRAVENOUS
  Administered 2022-06-08: 80 mg via INTRAVENOUS
  Administered 2022-06-08 (×2): 40 mg via INTRAVENOUS

## 2022-06-08 MED ORDER — MAGNESIUM SULFATE 2 GM/50ML IV SOLN: 2.0000 g | Freq: Once | INTRAVENOUS | Status: DC

## 2022-06-08 MED ORDER — POTASSIUM CHLORIDE CRYS ER 20 MEQ PO TBCR: 40.0000 meq | EXTENDED_RELEASE_TABLET | Freq: Once | ORAL | Status: DC

## 2022-06-08 MED ORDER — VITAMIN B-1 100 MG PO TABS: 100.0000 mg | ORAL_TABLET | Freq: Every day | ORAL | 0 refills | Status: AC

## 2022-06-08 MED ORDER — OMEPRAZOLE MAGNESIUM 20 MG PO TBEC: 20.0000 mg | DELAYED_RELEASE_TABLET | Freq: Two times a day (BID) | ORAL | 0 refills | Status: AC

## 2022-06-08 MED ORDER — POTASSIUM CHLORIDE CRYS ER 20 MEQ PO TBCR
40.0000 meq | EXTENDED_RELEASE_TABLET | Freq: Once | ORAL | Status: AC
  Administered 2022-06-08: 40 meq via ORAL
  Filled 2022-06-08: qty 2

## 2022-06-08 NOTE — Consult Note (Signed)
Louis Perry , MD [address] [place], [place], [place], [address] [address], [place], [place], [place], [address] Phone: [phone]  Fax: [phone]  Consultation  Referring Provider:     Dr Dwyane Dee Primary Care Physician:  System, Provider Not In Primary Gastroenterologist:  None          Reason for Consultation:     Hematemesis  Date of Admission:  06/07/2022 Date of Consultation:  06/08/2022         HPI:   Louis Perry is a 42 y.o. male present to the emergency room of hematemesis.  History of excess alcohol intake in recent days.  He states that he was doing fine till yesterday when he developed nausea through the day and gradually got worse after he ate some pineapple he had 3 bouts of nonbloody emesis where he was retching significantly.  Following which she had a bout of bloody vomitus that he had about a cupful of blood in it and none after that.  No bowel movements so far since the episode of hematemesis.  No further abdominal pain is doing well otherwise.  No blood thinner use no recent use of NSAIDs.  No similar episode in the past.  On admission hemoglobin of 15.3 g.  This morning is 14.1 g.  Alcohol level not detectable.  Urine drug screen shows cannabinoids are positive.   History reviewed. No pertinent past medical history.  History reviewed. No pertinent surgical history.  Prior to Admission medications   Medication Sig Start Date End Date Taking? Authorizing Provider  amLODipine (NORVASC) 10 MG tablet Take 10 mg by mouth daily. 02/12/22  Yes [provider]  atorvastatin (LIPITOR) 40 MG tablet Take 0.5 tablets by mouth at bedtime. 05/30/22  Yes [provider]  carvedilol (COREG) 12.5 MG tablet Take 12.5 mg by mouth every 12 (twelve) hours. 07/21/21  Yes [provider]  clindamycin (CLEOCIN) 300 MG capsule Take 300 mg by mouth every 8 (eight) hours. 06/05/22  Yes [provider]  hydrALAZINE (APRESOLINE) 100 MG tablet  Take 50 mg by mouth 3 (three) times daily. 07/21/21  Yes [provider]  sildenafil (REVATIO) 20 MG tablet Take 60 mg by mouth daily as needed.    [provider]    History reviewed. No pertinent family history.   Social History   Tobacco Use  . Smoking status: Never  . Smokeless tobacco: Never  Substance Use Topics  . Alcohol use: Yes    Allergies as of 06/07/2022 - Review Complete 06/07/2022  Allergen Reaction Noted  . Penicillins Rash 06/07/2022    Review of Systems:    All systems reviewed and negative except where noted in HPI.   Physical Exam:  Vital signs in last 24 hours: Temp:  [97.7 F (36.5 C)-99.5 F (37.5 C)] 98 F (36.7 C) (01/12 0801) Pulse Rate:  [68-120] 68 (01/12 0801) Resp:  [16-20] 20 (01/12 0801) BP: (161-179)/(106-135) 166/106 (01/12 0801) SpO2:  [95 %-100 %] 100 % (01/12 0801) Weight:  [110.5 kg-113 kg] 110.5 kg (01/11 2123)   General:   Pleasant, cooperative in NAD Head:  Normocephalic and atraumatic. Eyes:   No icterus.   Conjunctiva pink. PERRLA. Ears:  Normal auditory acuity. Neck:  Supple; no masses or thyroidomegaly Lungs: Respirations even and unlabored. Lungs clear to auscultation bilaterally.   No wheezes, crackles, or rhonchi.  Heart:  Regular rate and rhythm;  Without murmur, clicks, rubs or gallops Abdomen:  Soft, nondistended,  nontender. Normal bowel sounds. No appreciable masses or hepatomegaly.  No rebound or guarding.  Neurologic:  Alert and oriented x3;  grossly normal neurologically. Skin:  Intact without significant lesions or rashes. Cervical Nodes:  No significant cervical adenopathy. Psych:  Alert and cooperative. Normal affect.  LAB RESULTS: Recent Labs    06/07/22 1526 06/08/22 0430  WBC 4.8 4.8  HGB 15.3 14.1  HCT 46.1 42.6  PLT 302 278   BMET Recent Labs    06/07/22 1526 06/08/22 0430  NA 139 136  K 3.9 3.1*  CL 99 98  CO2 25 24  GLUCOSE 124* 108*  BUN 16 11  CREATININE 1.47* 1.29*   CALCIUM 9.9 8.8*   LFT Recent Labs    06/08/22 0430  PROT 8.1  ALBUMIN 4.1  AST 64*  ALT 56*  ALKPHOS 57  BILITOT 1.4*   PT/INR Recent Labs    06/08/22 0430  LABPROT 14.4  INR 1.1    STUDIES: DG Chest 2 View  Result Date: 06/07/2022 CLINICAL DATA:  Hemoptysis. EXAM: CHEST - 2 VIEW COMPARISON:  None Available. FINDINGS: Cardiac silhouette and mediastinal contours are within normal limits. The lungs are clear. No pleural effusion or pneumothorax. Mild multilevel degenerative disc changes of the thoracic spine. IMPRESSION: No active cardiopulmonary disease. Electronically Signed   By: Yvonne Kendall M.D.   On: 06/07/2022 17:21      Impression / Plan:   Louis Perry is a 42 y.o. y/o male presents to the hospital with hematemesis no significant drop in hemoglobin.  Cannabinoids noted in the urine.  Vomiting episodes may be precipitated by cannabis or from excess alcohol consumption.  Likely has had esophagitis or Mallory-Weiss tear which could have caused the hematemesis.  Plan 1.  EGD today  I have discussed alternative options, risks & benefits,  which include, but are not limited to, bleeding, infection, perforation,respiratory complication & drug reaction.  The patient agrees with this plan & written consent will be obtained.     Thank you for involving me in the care of this patient.      LOS: 1 day   Louis Bellows, MD  06/08/2022, 9:31 AM

## 2022-06-08 NOTE — Anesthesia Postprocedure Evaluation (Signed)
Anesthesia Post Note  Patient: Louis Perry  Procedure(s) Performed: ESOPHAGOGASTRODUODENOSCOPY (EGD) WITH PROPOFOL  Patient location during evaluation: Endoscopy Anesthesia Type: General Level of consciousness: awake and alert Pain management: pain level controlled Vital Signs Assessment: post-procedure vital signs reviewed and stable Respiratory status: spontaneous breathing, nonlabored ventilation, respiratory function stable and patient connected to nasal cannula oxygen Cardiovascular status: blood pressure returned to baseline and stable Postop Assessment: no apparent nausea or vomiting Anesthetic complications: no  No notable events documented.   Last Vitals:  Vitals:   06/08/22 1054 06/08/22 1104  BP: 117/77 (!) 137/90  Pulse: (!) 107 97  Resp: 20 20  Temp: (!) 36.1 C   SpO2: 97% 100%    Last Pain:  Vitals:   06/08/22 1104  TempSrc:   PainSc: 0-No pain                 Ilene Qua

## 2022-06-08 NOTE — Anesthesia Preprocedure Evaluation (Signed)
Anesthesia Evaluation  Patient identified by MRN, date of birth, ID band Patient awake    Reviewed: Allergy & Precautions, NPO status , Patient's Chart, lab work & pertinent test results  History of Anesthesia Complications Negative for: history of anesthetic complications  Airway Mallampati: II  TM Distance: >3 FB Neck ROM: full    Dental no notable dental hx.    Pulmonary neg pulmonary ROS   Pulmonary exam normal        Cardiovascular hypertension, Pt. on medications and On Home Beta Blockers Normal cardiovascular exam     Neuro/Psych negative neurological ROS  negative psych ROS   GI/Hepatic negative GI ROS,,,(+)     substance abuse  alcohol use  Endo/Other  negative endocrine ROS    Renal/GU negative Renal ROS  negative genitourinary   Musculoskeletal   Abdominal   Peds  Hematology negative hematology ROS (+)   Anesthesia Other Findings History reviewed. No pertinent past medical history.  History reviewed. No pertinent surgical history.  BMI    Body Mass Index: 33.98 kg/m      Reproductive/Obstetrics negative OB ROS                             Anesthesia Physical Anesthesia Plan  ASA: 2  Anesthesia Plan: General   Post-op Pain Management: Minimal or no pain anticipated   Induction: Intravenous  PONV Risk Score and Plan: Propofol infusion and TIVA  Airway Management Planned: Natural Airway and Nasal Cannula  Additional Equipment:   Intra-op Plan:   Post-operative Plan:   Informed Consent: I have reviewed the patients History and Physical, chart, labs and discussed the procedure including the risks, benefits and alternatives for the proposed anesthesia with the patient or authorized representative who has indicated his/her understanding and acceptance.     Dental Advisory Given  Plan Discussed with: Anesthesiologist, CRNA and Surgeon  Anesthesia Plan  Comments: (Patient consented for risks of anesthesia including but not limited to:  - adverse reactions to medications - risk of airway placement if required - damage to eyes, teeth, lips or other oral mucosa - nerve damage due to positioning  - sore throat or hoarseness - Damage to heart, brain, nerves, lungs, other parts of body or loss of life  Patient voiced understanding.)       Anesthesia Quick Evaluation

## 2022-06-08 NOTE — OR Nursing (Signed)
Notified MD regarding bp, called RN on floor for pt to take bp meds once returns to floor as did not rec'd this am.

## 2022-06-08 NOTE — Transfer of Care (Signed)
Immediate Anesthesia Transfer of Care Note  Patient: Louis Perry  Procedure(s) Performed: ESOPHAGOGASTRODUODENOSCOPY (EGD) WITH PROPOFOL  Patient Location: PACU and Endoscopy Unit  Anesthesia Type:General  Level of Consciousness: drowsy and patient cooperative  Airway & Oxygen Therapy: Patient Spontanous Breathing  Post-op Assessment: Report given to RN and Post -op Vital signs reviewed and stable  Post vital signs: Reviewed and stable  Last Vitals:  Vitals Value Taken Time  BP 117/77 06/08/22 1054  Temp 36.1 C 06/08/22 1054  Pulse 104 06/08/22 1054  Resp 23 06/08/22 1054  SpO2 95 % 06/08/22 1054  Vitals shown include unvalidated device data.  Last Pain:  Vitals:   06/08/22 1054  TempSrc: Temporal  PainSc: 0-No pain         Complications: No notable events documented.

## 2022-06-08 NOTE — Discharge Summary (Signed)
[facility] Discharge Summary   Patient: Louis Perry IRJ:[id number]  PCP: System, Provider Not In  Date of admission: 06/07/2022   Date of discharge:  06/08/2022     Discharge Diagnoses:  Principal Problem:   Upper GI bleeding Active Problems:   Acute upper GI bleeding   Admitted From: Home Disposition:  Home   Recommendations for Outpatient Follow-up:  PCP: in 1 wk F/u GI persistent GERD or epigastric discomfort, GI bleed Follow up LABS/TEST:     Diet recommendation: Cardiac diet  Activity: The patient is advised to gradually reintroduce usual activities, as tolerated  Discharge Condition: stable  Code Status: Full code   History of present illness: As per the H and P dictated on admission  Hospital Course:  Camden Knotek Walby is a 42 y.o. male with Past medical history of hypertension, presented at [facility] ED with complaining of hematemesis.  As per patient he was working on the car off his wife and then he was not feeling good, after some time he started having vomiting and noticed blood, patient had 4 episodes of hematemesis which was increasing in amount and he got scared and came to the hospital.  Recently patient is being drinking more alcohol, he normally drinks every day and continues to drinking for past more than 5 years.  Denies any withdrawal symptoms.  Denies any prior history of GI bleeding.  Denies NSAID use, and  denies any drug abuse.  Denies any chest pain or palpitations, no shortness of breath. ED Course: H&H 15.3/46 stable Creatinine 1.47 slightly elevated, blood glucose 124 slightly elevated, AST 93, ALT 74 slightly elevated, rest of the labs within normal range. CXR negative for any acute findings    Assessment/Plan  # Upper GI bleeding Patient presented with hematemesis, history of heavy alcohol drinking for past 1 month. S/p Pantoprazole 80 mg x 1 dose given in the ED, s/p pantoprazole 40 mg IV twice daily. Started clear liquid diet, keep  n.p.o. after midnight, GI consulted, s/p EGD shows grade B esophagitis, no any other significant findings.  Patient was cleared by GI to discharge on Prilosec 20 mg p.o. twice daily for 6 weeks.  EtOH and drug abuse abstinence counseling done. # Hypertension and tachycardia, s/p metoprolol 50 mg p.o. twice daily, resumed home medications blood pressure remained under control.  Patient was advised to monitor BP at home and follow with PCP. # EtOH abuse S/p CIWA protocol, no withdrawal symptoms during hospital stay. S/p thiamine, folic acid and multivitamin.  Patient was discharged on thiamine 100 mg p.o. daily for 30 days.    Body mass index is 33.98 kg/m.  Nutrition Interventions:   Patient was ambulatory without any assistance. On the day of the discharge the patient's vitals were stable, and no other acute medical condition were reported by patient. the patient was felt safe to be discharge at Home.  Consultants: GI Procedures: s/p EGD  Impression:- Normal examined duodenum. Normal stomach. LA Grade B esophagitis. No specimens collected. Recommendation:  discharge same day. Advance diet as tolerated. Discharge home on prilosec 20 mg BID for 6 weeks. Stop all alcohol and marijuana use  Discharge Exam: General: Appear in no distress, no Rash; Oral Mucosa Clear, moist. Cardiovascular: S1 and S2 Present, no Murmur, Respiratory: normal respiratory effort, Bilateral Air entry present and n Crackles, no wheezes Abdomen: Bowel Sound present, Soft and no tenderness, no hernia Extremities: no Pedal edema, no calf tenderness Neurology: alert and oriented to time, place, and person  affect appropriate.  Filed Weights   06/07/22 1516 06/07/22 2100 06/07/22 2123  Weight: 111.1 kg 113 kg 110.5 kg   Vitals:   06/08/22 1113 06/08/22 1114  BP: (!) 149/108 (!) 150/105  Pulse: 89 86  Resp: 16 16  Temp:    SpO2: 98% 98%    DISCHARGE MEDICATION: Allergies as of 06/08/2022       Reactions    Penicillins Rash        Medication List     STOP taking these medications    clindamycin 300 MG capsule Commonly known as: CLEOCIN       TAKE these medications    amLODipine 10 MG tablet Commonly known as: NORVASC Take 10 mg by mouth daily.   atorvastatin 40 MG tablet Commonly known as: LIPITOR Take 0.5 tablets by mouth at bedtime.   carvedilol 12.5 MG tablet Commonly known as: COREG Take 12.5 mg by mouth every 12 (twelve) hours.   hydrALAZINE 100 MG tablet Commonly known as: APRESOLINE Take 50 mg by mouth 3 (three) times daily.   omeprazole 20 MG tablet Commonly known as: PriLOSEC OTC Take 1 tablet (20 mg total) by mouth in the morning and at bedtime.   sildenafil 20 MG tablet Commonly known as: REVATIO Take 60 mg by mouth daily as needed.   thiamine 100 MG tablet Commonly known as: Vitamin B-1 Take 1 tablet (100 mg total) by mouth daily. Start taking on: June 09, 2022       Allergies  Allergen Reactions   Penicillins Rash   Discharge Instructions     Call MD for:  difficulty breathing, headache or visual disturbances   Complete by: As directed    Call MD for:  extreme fatigue   Complete by: As directed    Call MD for:  persistant dizziness or light-headedness   Complete by: As directed    Call MD for:  persistant nausea and vomiting   Complete by: As directed    Call MD for:  severe uncontrolled pain   Complete by: As directed    Call MD for:  temperature >100.4   Complete by: As directed    Diet - low sodium heart healthy   Complete by: As directed    Discharge instructions   Complete by: As directed    Follow-up with PCP in 1 week, continue PPI 20 mg p.o. twice daily for 6 weeks, stop alcohol and marijuana.  Continue thiamine for 30 days.  Follow with PCP for further management as an outpatient.   Increase activity slowly   Complete by: As directed        The results of significant diagnostics from this hospitalization (including  imaging, microbiology, ancillary and laboratory) are listed below for reference.    Significant Diagnostic Studies: DG Chest 2 View  Result Date: 06/07/2022 CLINICAL DATA:  Hemoptysis. EXAM: CHEST - 2 VIEW COMPARISON:  None Available. FINDINGS: Cardiac silhouette and mediastinal contours are within normal limits. The lungs are clear. No pleural effusion or pneumothorax. Mild multilevel degenerative disc changes of the thoracic spine. IMPRESSION: No active cardiopulmonary disease. Electronically Signed   By: Neita Garnet M.D.   On: 06/07/2022 17:21    Microbiology: No results found for this or any previous visit (from the past 240 hour(s)).   Labs: CBC: Recent Labs  Lab 06/07/22 1526 06/08/22 0430  WBC 4.8 4.8  HGB 15.3 14.1  HCT 46.1 42.6  MCV 87.1 87.8  PLT 302 278   Basic Metabolic  Panel: Recent Labs  Lab 06/07/22 1526 06/08/22 0430  NA 139 136  K 3.9 3.1*  CL 99 98  CO2 25 24  GLUCOSE 124* 108*  BUN 16 11  CREATININE 1.47* 1.29*  CALCIUM 9.9 8.8*  MG 1.7 1.5*  PHOS 2.7 3.2   Liver Function Tests: Recent Labs  Lab 06/07/22 1526 06/08/22 0430  AST 93* 64*  ALT 74* 56*  ALKPHOS 74 57  BILITOT 0.9 1.4*  PROT 9.8* 8.1  ALBUMIN 4.9 4.1   No results for input(s): "LIPASE", "AMYLASE" in the last 168 hours. No results for input(s): "AMMONIA" in the last 168 hours. Cardiac Enzymes: No results for input(s): "CKTOTAL", "CKMB", "CKMBINDEX", "TROPONINI" in the last 168 hours. BNP (last 3 results) No results for input(s): "BNP" in the last 8760 hours. CBG: No results for input(s): "GLUCAP" in the last 168 hours.  Time spent: 35 minutes  Signed:  Val Riles  [facility] 06/08/2022 1:44 PM

## 2022-06-08 NOTE — Op Note (Signed)
[facility] Patient Name: Louis Perry Procedure Date: 06/08/2022 10:37 AM MRN: [id number] Account #: [id number] Date of Birth: 1980-10-13 Admit Type: Inpatient Age: 42 Room: [facility] ENDO ROOM 4 Gender: Male Note Status: Finalized Instrument Name: Upper Endoscope [id number] Procedure:             Upper GI endoscopy Indications:           Hematemesis Providers:             Jonathon Bellows MD, MD Referring MD:          No Local Md, MD (Referring MD) Medicines:             Monitored Anesthesia Care Complications:         No immediate complications. Procedure:             Pre-Anesthesia Assessment:                        - Prior to the procedure, a History and Physical was                         performed, and patient medications, allergies and                         sensitivities were reviewed. The patient's tolerance                         of previous anesthesia was reviewed.                        - The risks and benefits of the procedure and the                         sedation options and risks were discussed with the                         patient. All questions were answered and informed                         consent was obtained.                        - ASA Grade Assessment: II - A patient with mild                         systemic disease.                        After obtaining informed consent, the endoscope was                         passed under direct vision. Throughout the procedure,                         the patient's blood pressure, pulse, and oxygen                         saturations were monitored continuously. The Endoscope                         was introduced  through the mouth, and advanced to the                         third part of duodenum. The upper GI endoscopy was                         accomplished with ease. The patient tolerated the                         procedure well. Findings:      The examined duodenum  was normal.      The stomach was normal.      The cardia and gastric fundus were normal on retroflexion.      LA Grade B (one or more mucosal breaks greater than 5 mm, not extending       between the tops of two mucosal folds) esophagitis was found at the       gastroesophageal junction. Impression:            - Normal examined duodenum.                        - Normal stomach.                        - LA Grade B esophagitis.                        - No specimens collected. Recommendation:        - Return patient to hospital ward for possible                         discharge same day.                        - Advance diet as tolerated.                        - Discharge home on prilosec 20 mg BID for 6 weeks                        Stop all alcohol and marijuana use Procedure Code(s):     --- Professional ---                        [phone], Esophagogastroduodenoscopy, flexible,                         transoral; diagnostic, including collection of                         specimen(s) by brushing or washing, when performed                         (separate procedure) Diagnosis Code(s):     --- Professional ---                        K20.90, Esophagitis, unspecified without bleeding                        K92.0, Hematemesis CPT copyright 2022 American Medical Association. All rights reserved.  The codes documented in this report are preliminary and upon coder review may  be revised to meet current compliance requirements. Jonathon Bellows, MD Jonathon Bellows MD, MD 06/08/2022 10:49:24 AM This report has been signed electronically. Number of Addenda: 0 Note Initiated On: 06/08/2022 10:37 AM Estimated Blood Loss:  Estimated blood loss: none.      [facility]

## 2022-06-08 NOTE — TOC Initial Note (Signed)
Transition of Care [facility]) - Initial/Assessment Note    Patient Details  Name: Louis Perry MRN: [id number] Date of Birth: 1981-02-09  [facility] CM/SW Contact:    Laurena Slimmer, RN Phone Number: 06/08/2022, 12:57 PM  Clinical Narrative:                  Transition of Care (TOC) Screening Note   Patient Details  Name: Louis Perry Date of Birth: [birth year]   Transition of Care [facility]) CM/SW Contact:    Laurena Slimmer, RN Phone Number: 06/08/2022, 12:57 PM    Transition of Care Department [facility]) has reviewed patient and no TOC needs have been identified at this time. We will continue to monitor patient advancement through interdisciplinary progression rounds. If new patient transition needs arise, please place a TOC consult.   TOC consulted for substance abuse.  Spoke with patient regarding SA resources. He is agreeable to receive resources. SA resources added to AVS.         Patient Goals and CMS Choice            Expected Discharge Plan and Services                                              Prior Living Arrangements/Services                       Activities of Daily Living Home Assistive Devices/Equipment: CPAP ADL Screening (condition at time of admission) Patient's cognitive ability adequate to safely complete daily activities?: Yes Is the patient deaf or have difficulty hearing?: No Does the patient have difficulty seeing, even when wearing glasses/contacts?: No Does the patient have difficulty concentrating, remembering, or making decisions?: No Patient able to express need for assistance with ADLs?: Yes Does the patient have difficulty dressing or bathing?: No Independently performs ADLs?: Yes (appropriate for developmental age) Does the patient have difficulty walking or climbing stairs?: No Weakness of Legs: None Weakness of Arms/Hands: None  Permission Sought/Granted                   Emotional Assessment              Admission diagnosis:  Upper GI bleeding [K92.2] Gastrointestinal hemorrhage, unspecified gastrointestinal hemorrhage type [K92.2] Patient Active Problem List   Diagnosis Date Noted   Upper GI bleeding 06/07/2022   PCP:  System, Provider Not In Pharmacy:   [facility], [place] [address] [place] [address] Phone: [phone] Fax: [phone]     Social Determinants of Health (SDOH) Social History: SDOH Screenings   Food Insecurity: No Food Insecurity (06/07/2022)  Housing: Low Risk  (06/07/2022)  Transportation Needs: No Transportation Needs (06/07/2022)  Utilities: Not At Risk (06/07/2022)  Tobacco Use: Low Risk  (06/08/2022)   SDOH Interventions:     Readmission Risk Interventions     No data to display

## 2022-06-11 ENCOUNTER — Encounter: Payer: Self-pay | Admitting: Gastroenterology

## 2022-10-01 ENCOUNTER — Emergency Department
Admission: EM | Admit: 2022-10-01 | Discharge: 2022-10-01 | Disposition: A | Discharge status: Home / Self Care | Payer: Federal, State, Local not specified - PPO | Attending: Emergency Medicine | Admitting: Emergency Medicine

## 2022-10-01 ENCOUNTER — Encounter: Payer: Self-pay | Admitting: Emergency Medicine

## 2022-10-01 ENCOUNTER — Emergency Department: Payer: Federal, State, Local not specified - PPO

## 2022-10-01 ENCOUNTER — Other Ambulatory Visit: Payer: Self-pay

## 2022-10-01 DIAGNOSIS — R778 Other specified abnormalities of plasma proteins: Secondary | ICD-10-CM | POA: Insufficient documentation

## 2022-10-01 DIAGNOSIS — R0602 Shortness of breath: Secondary | ICD-10-CM | POA: Diagnosis present

## 2022-10-01 DIAGNOSIS — R002 Palpitations: Secondary | ICD-10-CM

## 2022-10-01 DIAGNOSIS — I10 Essential (primary) hypertension: Secondary | ICD-10-CM

## 2022-10-01 DIAGNOSIS — R944 Abnormal results of kidney function studies: Secondary | ICD-10-CM | POA: Insufficient documentation

## 2022-10-01 DIAGNOSIS — Z1152 Encounter for screening for COVID-19: Secondary | ICD-10-CM | POA: Diagnosis not present

## 2022-10-01 DIAGNOSIS — R197 Diarrhea, unspecified: Secondary | ICD-10-CM | POA: Diagnosis not present

## 2022-10-01 DIAGNOSIS — R112 Nausea with vomiting, unspecified: Secondary | ICD-10-CM

## 2022-10-01 HISTORY — DX: Essential (primary) hypertension: I10

## 2022-10-01 LAB — BASIC METABOLIC PANEL
Anion gap: 19 — ABNORMAL HIGH (ref 5–15)
BUN: 13 mg/dL (ref 6–20)
CO2: 22 mmol/L (ref 22–32)
Calcium: 9.2 mg/dL (ref 8.9–10.3)
Chloride: 94 mmol/L — ABNORMAL LOW (ref 98–111)
Creatinine, Ser: 1.35 mg/dL — ABNORMAL HIGH (ref 0.61–1.24)
GFR, Estimated: >60 mL/min (ref >60)
Glucose, Bld: 149 mg/dL — ABNORMAL HIGH (ref 70–99)
Potassium: 3.3 mmol/L — ABNORMAL LOW (ref 3.5–5.1)
Sodium: 135 mmol/L (ref 135–145)

## 2022-10-01 LAB — CBC
HCT: 40.9 % (ref 39.0–52.0)
Hemoglobin: 13.9 g/dL (ref 13.0–17.0)
MCH: 30.2 pg (ref 26.0–34.0)
MCHC: 34 g/dL (ref 30.0–36.0)
MCV: 88.9 fL (ref 80.0–100.0)
Platelets: 251 10*3/uL (ref 150–400)
RBC: 4.6 MIL/uL (ref 4.22–5.81)
RDW: 14.1 % (ref 11.5–15.5)
WBC: 4.7 10*3/uL (ref 4.0–10.5)
nRBC: 0 % (ref 0.0–0.2)

## 2022-10-01 LAB — TROPONIN I (HIGH SENSITIVITY)
Troponin I (High Sensitivity): 29 ng/L — ABNORMAL HIGH (ref <18)
Troponin I (High Sensitivity): 21 ng/L — ABNORMAL HIGH (ref <18)

## 2022-10-01 LAB — SARS CORONAVIRUS 2 BY RT PCR: SARS Coronavirus 2 by RT PCR: NEGATIVE

## 2022-10-01 MED ORDER — ONDANSETRON 4 MG PO TBDP: 4.0000 mg | ORAL_TABLET | Freq: Three times a day (TID) | ORAL | 0 refills | Status: AC | PRN

## 2022-10-01 MED ORDER — POTASSIUM CHLORIDE CRYS ER 20 MEQ PO TBCR
20.0000 meq | EXTENDED_RELEASE_TABLET | Freq: Once | ORAL | Status: AC
  Administered 2022-10-01: 20 meq via ORAL
  Filled 2022-10-01: qty 1

## 2022-10-01 MED ORDER — ONDANSETRON HCL 4 MG/2ML IJ SOLN
4.0000 mg | Freq: Once | INTRAMUSCULAR | Status: AC
  Administered 2022-10-01: 4 mg via INTRAVENOUS
  Filled 2022-10-01: qty 2

## 2022-10-01 MED ORDER — LACTATED RINGERS IV BOLUS
1000.0000 mL | Freq: Once | INTRAVENOUS | Status: AC
  Administered 2022-10-01: 1000 mL via INTRAVENOUS

## 2022-10-01 NOTE — ED Triage Notes (Signed)
C/O decreased appetite x 2-3 days.  States last night felt heart palpitations and SOB.  Symptoms continue this morning.  Also C/O fever and chills yesterday.  Also runny nose.

## 2022-10-01 NOTE — ED Provider Notes (Addendum)
[facility] Provider Note    Event Date/Time   First MD Initiated Contact with Patient 10/01/22 1133     (approximate)   History   Chief Complaint Shortness of Breath   HPI  Louis Perry is a 42 y.o. male with past medical history of hypertension and GI bleeding who presents to the ED complaining of shortness of breath.  Patient reports that for the past 2 days he has been feeling mildly short of breath with nausea, vomiting, and diarrhea.  He has been feeling weak and malaised with subjective fevers, but denies any cough or chest pain.  He does report that he has been feeling like his heart is racing intermittently, denies any history of arrhythmia.  He has not had any pain or swelling in his legs.  He is not aware of any sick contacts.     Physical Exam   Triage Vital Signs: ED Triage Vitals  Enc Vitals Group     BP 10/01/22 1044 (!) 171/121     Pulse Rate 10/01/22 1044 (!) 128     Resp 10/01/22 1044 16     Temp 10/01/22 1044 98.4 F (36.9 C)     Temp Source 10/01/22 1044 Oral     SpO2 10/01/22 1044 97 %     Weight 10/01/22 1041 243 lb 9.7 oz (110.5 kg)     Height 10/01/22 1041 5\' 11"  (1.803 m)     Head Circumference --      Peak Flow --      Pain Score 10/01/22 1041 9     Pain Loc --      Pain Edu? --      Excl. in GC? --     Most recent vital signs: Vitals:   10/01/22 1300 10/01/22 1315  BP: (!) 185/122   Pulse: 78   Resp: 15 14  Temp:    SpO2: 97%     Constitutional: Alert and oriented. Eyes: Conjunctivae are normal. Head: Atraumatic. Nose: No congestion/rhinnorhea. Mouth/Throat: Mucous membranes are moist.  Cardiovascular: Normal rate, regular rhythm. Grossly normal heart sounds.  2+ radial pulses bilaterally. Respiratory: Normal respiratory effort.  No retractions. Lungs CTAB. Gastrointestinal: Soft and nontender. No distention. Musculoskeletal: No lower extremity tenderness nor edema.  Neurologic:  Normal speech and  language. No gross focal neurologic deficits are appreciated.    ED Results / Procedures / Treatments   Labs (all labs ordered are listed, but only abnormal results are displayed) Labs Reviewed  BASIC METABOLIC PANEL - Abnormal; Notable for the following components:      Result Value   Potassium 3.3 (*)    Chloride 94 (*)    Glucose, Bld 149 (*)    Creatinine, Ser 1.35 (*)    Anion gap 19 (*)    All other components within normal limits  TROPONIN I (HIGH SENSITIVITY) - Abnormal; Notable for the following components:   Troponin I (High Sensitivity) 29 (*)    All other components within normal limits  TROPONIN I (HIGH SENSITIVITY) - Abnormal; Notable for the following components:   Troponin I (High Sensitivity) 21 (*)    All other components within normal limits  SARS CORONAVIRUS 2 BY RT PCR  CBC     EKG  ED ECG REPORT I, Chesley Noon, the attending physician, personally viewed and interpreted this ECG.   Date: 10/01/2022  EKG Time: 10:49  Rate: 90  Rhythm: normal sinus rhythm  Axis: Normal  Intervals:left posterior fascicular  block  ST&T Change: None  RADIOLOGY Chest x-ray reviewed and interpreted by me with no infiltrate, edema, or effusion.  PROCEDURES:  Critical Care performed: No  Procedures   MEDICATIONS ORDERED IN ED: Medications  lactated ringers bolus 1,000 mL (0 mLs Intravenous Stopped 10/01/22 1322)  ondansetron (ZOFRAN) injection 4 mg (4 mg Intravenous Given 10/01/22 1242)  potassium chloride SA (KLOR-CON M) CR tablet 20 mEq (20 mEq Oral Given 10/01/22 1322)     IMPRESSION / MDM / ASSESSMENT AND PLAN / ED COURSE  I reviewed the triage vital signs and the nursing notes.                              42 y.o. male with past medical history of hypertension and GI bleed who presents to the ED complaining of 2 days of nausea, vomiting, diarrhea, fevers, chills, and shortness of breath.  Patient's presentation is most consistent with acute presentation  with potential threat to life or bodily function.  Differential diagnosis includes, but is not limited to, pneumonia, arrhythmia, dehydration, electrolyte abnormality, AKI, anemia, viral syndrome, bronchitis.  Patient nontoxic-appearing and in no acute distress, vital signs remarkable for tachycardia and hypertension, but patient is not in any respiratory distress and maintaining oxygen saturations 97% on room air.  Her lungs are clear to auscultation bilaterally and he appears well, chest x-ray with no evidence of pneumonia or other acute process.  COVID-19 testing is negative, but symptoms seem most consistent with viral illness.  Additional labs are reassuring with no significant anemia, leukocytosis, lecture abnormality, or AKI.  We will treat symptomatically with IV Zofran, hydrate with IV fluids, observe on cardiac monitor, and reassess.  He does have mildly elevated troponin, however this is stable on recheck and he does have chronic mild elevation in creatinine that could be contributing to this.  Low suspicion for ACS or PE at this time and patient is appropriate for discharge home with PCP/Cardiology follow-up.  He does have elevated blood pressure here in the ED, states he had been unable to tolerate his oral medications today but has the medications available at home.  He is now tolerating oral intake without difficulty.  He was counseled to return to the ED for new or worsening symptoms, patient agrees with plan.      FINAL CLINICAL IMPRESSION(S) / ED DIAGNOSES   Final diagnoses:  Palpitations  Uncontrolled hypertension  Nausea and vomiting, unspecified vomiting type     Rx / DC Orders   ED Discharge Orders          Ordered    Ambulatory referral to Cardiology        10/01/22 1359    ondansetron (ZOFRAN-ODT) 4 MG disintegrating tablet  Every 8 hours PRN        10/01/22 1359             Note:  This document was prepared using Dragon voice recognition software and may  include unintentional dictation errors.   Chesley Noon, MD 10/01/22 1400    Chesley Noon, MD 10/01/22 1401

## 2022-11-06 ENCOUNTER — Ambulatory Visit: Payer: Federal, State, Local not specified - PPO | Attending: Internal Medicine | Admitting: Internal Medicine

## 2022-11-06 NOTE — Progress Notes (Deleted)
Cardiology Office Note:    Date:  11/06/2022   ID:  Roxy Manns Cicalese, DOB 08/03/1980, MRN [id number]  PCP:  [facility]   [facility] Providers Cardiologist:  None { Click to update primary MD,subspecialty MD or APP then REFRESH:1}    Referring MD: [facility]*   No chief complaint on file. ***  History of Present Illness:    Louis Perry is a 42 y.o. male with a hx of HTN, referral for palpitations. Went to the ED in early May BP was high 171/121 mmHg and 185/122 mmHg. Trops negative. EKG showed sinus rhythm with LPFB.  Past Medical History:  Diagnosis Date   Hypertension     Past Surgical History:  Procedure Laterality Date   ESOPHAGOGASTRODUODENOSCOPY (EGD) WITH PROPOFOL N/A 06/08/2022   Procedure: ESOPHAGOGASTRODUODENOSCOPY (EGD) WITH PROPOFOL;  Surgeon: Wyline Mood, MD;  Location: [facility] ENDOSCOPY;  Service: Gastroenterology;  Laterality: N/A;    Current Medications: No outpatient medications have been marked as taking for the 11/06/22 encounter (Appointment) with Maisie Fus, MD.     Allergies:   Penicillins   Social History   Socioeconomic History   Marital status: Married    Spouse name: Not on file   Number of children: Not on file   Years of education: Not on file   Highest education level: Not on file  Occupational History   Not on file  Tobacco Use   Smoking status: Never   Smokeless tobacco: Never  Substance and Sexual Activity   Alcohol use: Yes    Comment: every day   Drug use: Yes    Types: Marijuana   Sexual activity: Not on file  Other Topics Concern   Not on file  Social History Narrative   Not on file   Social Determinants of Health   Financial Resource Strain: Not on file  Food Insecurity: No Food Insecurity (06/07/2022)   Hunger Vital Sign    Worried About Running Out of Food in the Last Year: Never true    Ran Out of Food in the Last Year: Never true  Transportation Needs: No  Transportation Needs (06/07/2022)   PRAPARE - Administrator, Civil Service (Medical): No    Lack of Transportation (Non-Medical): No  Physical Activity: Not on file  Stress: Not on file  Social Connections: Not on file     Family History: The patient's ***family history is not on file.  ROS:   Please see the history of present illness.    *** All other systems reviewed and are negative.  EKGs/Labs/Other Studies Reviewed:    The following studies were reviewed today: ***  EKG:  EKG is *** ordered today.  The ekg ordered today demonstrates ***  Recent Labs: 06/08/2022: ALT 56; Magnesium 1.5 10/01/2022: BUN 13; Creatinine, Ser 1.35; Hemoglobin 13.9; Platelets 251; Potassium 3.3; Sodium 135  Recent Lipid Panel No results found for: "CHOL", "TRIG", "HDL", "CHOLHDL", "VLDL", "LDLCALC", "LDLDIRECT"   Risk Assessment/Calculations:   {Does this patient have ATRIAL FIBRILLATION?:[id number]}  No BP recorded.  {Refresh Note OR Click here to enter BP  :1}***         Physical Exam:    VS:  There were no vitals taken for this visit.    Wt Readings from Last 3 Encounters:  10/01/22 243 lb 9.7 oz (110.5 kg)  06/07/22 243 lb 9.7 oz (110.5 kg)     GEN: *** Well nourished, well developed in no acute  distress HEENT: Normal NECK: No JVD; No carotid bruits LYMPHATICS: No lymphadenopathy CARDIAC: ***RRR, no murmurs, rubs, gallops RESPIRATORY:  Clear to auscultation without rales, wheezing or rhonchi  ABDOMEN: Soft, non-tender, non-distended MUSCULOSKELETAL:  No edema; No deformity  SKIN: Warm and dry NEUROLOGIC:  Alert and oriented x 3 PSYCHIATRIC:  Normal affect   ASSESSMENT:    No diagnosis found. PLAN:    In order of problems listed above:  ***      {Are you ordering a CV Procedure (e.g. stress test, cath, DCCV, TEE, etc)?   Press F2        :[id number]}    Medication Adjustments/Labs and Tests Ordered: Current medicines are reviewed at length with the  patient today.  Concerns regarding medicines are outlined above.  No orders of the defined types were placed in this encounter.  No orders of the defined types were placed in this encounter.   There are no Patient Instructions on file for this visit.   Signed, Maisie Fus, MD  11/06/2022 12:48 PM    [facility]

## 2023-11-06 ENCOUNTER — Other Ambulatory Visit: Payer: Self-pay

## 2023-11-06 ENCOUNTER — Emergency Department: Admission: EM | Admit: 2023-11-06 | Discharge: 2023-11-06 | Disposition: A | Discharge status: Home / Self Care

## 2023-11-06 ENCOUNTER — Emergency Department

## 2023-11-06 DIAGNOSIS — Y92410 Unspecified street and highway as the place of occurrence of the external cause: Secondary | ICD-10-CM | POA: Insufficient documentation

## 2023-11-06 DIAGNOSIS — S39012A Strain of muscle, fascia and tendon of lower back, initial encounter: Secondary | ICD-10-CM | POA: Insufficient documentation

## 2023-11-06 DIAGNOSIS — S3992XA Unspecified injury of lower back, initial encounter: Secondary | ICD-10-CM | POA: Diagnosis present

## 2023-11-06 DIAGNOSIS — M545 Low back pain, unspecified: Secondary | ICD-10-CM

## 2023-11-06 MED ORDER — CYCLOBENZAPRINE HCL 5 MG PO TABS: 5.0000 mg | ORAL_TABLET | Freq: Three times a day (TID) | ORAL | 0 refills | Status: AC | PRN

## 2023-11-06 MED ORDER — IBUPROFEN 200 MG PO TABS: 600.0000 mg | ORAL_TABLET | Freq: Three times a day (TID) | ORAL | 2 refills | Status: AC | PRN

## 2023-11-06 MED ORDER — ACETAMINOPHEN 500 MG PO TABS
1000.0000 mg | ORAL_TABLET | Freq: Once | ORAL | Status: AC
  Administered 2023-11-06: 1000 mg via ORAL
  Filled 2023-11-06: qty 2

## 2023-11-06 MED ORDER — ACETAMINOPHEN 500 MG PO TABS: 1000.0000 mg | ORAL_TABLET | Freq: Four times a day (QID) | ORAL | 2 refills | Status: AC | PRN

## 2023-11-06 MED ORDER — IBUPROFEN 600 MG PO TABS
600.0000 mg | ORAL_TABLET | Freq: Once | ORAL | Status: AC
  Administered 2023-11-06: 600 mg via ORAL
  Filled 2023-11-06: qty 1

## 2023-11-06 MED ORDER — LIDOCAINE 5 % EX PTCH
1.0000 | MEDICATED_PATCH | CUTANEOUS | Status: DC
  Administered 2023-11-06: 1 via TRANSDERMAL
  Filled 2023-11-06: qty 1

## 2023-11-06 MED ORDER — LIDOCAINE 5 % EX PTCH: 1.0000 | MEDICATED_PATCH | CUTANEOUS | 0 refills | Status: AC
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
## 2023-11-06 NOTE — ED Notes (Signed)
 Patient Alert and oriented to baseline. Stable and ambulatory to baseline. Patient verbalized understanding of the discharge instructions.  Patient belongings were taken by the patient.
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
## 2023-11-06 NOTE — Discharge Instructions (Addendum)
 Your evaluation in the emergency department is reassuring, and an x-ray of your back showed no fractures.  I do suspect you have a strain of the muscles of your lower back, and I have prescribed several medications to help with this.  Please do follow-up with your primary care provider for reevaluation, and return to the emergency department with any new or worsening symptoms.
